# Patient Record
Sex: Female | Born: 1985 | Race: Black or African American | Hispanic: No | Marital: Married | Smoking: Current every day smoker
Health system: Southern US, Community
[De-identification: ages and names within clinical notes are randomized; demographics above are authoritative.]

---

## 2013-09-16 ENCOUNTER — Emergency Department (HOSPITAL_COMMUNITY)
Admission: EM | Admit: 2013-09-16 | Discharge: 2013-09-16 | Disposition: A | Discharge status: Home / Self Care | Payer: 59 | Attending: Emergency Medicine | Admitting: Emergency Medicine

## 2013-09-16 ENCOUNTER — Encounter (HOSPITAL_COMMUNITY): Payer: Self-pay | Admitting: Emergency Medicine

## 2013-09-16 DIAGNOSIS — F17200 Nicotine dependence, unspecified, uncomplicated: Secondary | ICD-10-CM | POA: Insufficient documentation

## 2013-09-16 DIAGNOSIS — Z79899 Other long term (current) drug therapy: Secondary | ICD-10-CM | POA: Insufficient documentation

## 2013-09-16 DIAGNOSIS — Z3202 Encounter for pregnancy test, result negative: Secondary | ICD-10-CM | POA: Insufficient documentation

## 2013-09-16 DIAGNOSIS — E871 Hypo-osmolality and hyponatremia: Secondary | ICD-10-CM | POA: Insufficient documentation

## 2013-09-16 DIAGNOSIS — N949 Unspecified condition associated with female genital organs and menstrual cycle: Secondary | ICD-10-CM | POA: Insufficient documentation

## 2013-09-16 DIAGNOSIS — N912 Amenorrhea, unspecified: Secondary | ICD-10-CM | POA: Insufficient documentation

## 2013-09-16 LAB — CBC WITH DIFFERENTIAL/PLATELET
Basophils Absolute: 0 10*3/uL (ref 0.0–0.1)
Basophils Relative: 0 % (ref 0–1)
Eosinophils Relative: 3 % (ref 0–5)
Lymphocytes Relative: 26 % (ref 12–46)
Lymphs Abs: 1.7 10*3/uL (ref 0.7–4.0)
MCV: 96 fL (ref 78.0–100.0)
Neutro Abs: 3.9 10*3/uL (ref 1.7–7.7)
Neutrophils Relative %: 61 % (ref 43–77)
Platelets: 255 10*3/uL (ref 150–400)
RDW: 12 % (ref 11.5–15.5)
WBC: 6.4 10*3/uL (ref 4.0–10.5)

## 2013-09-16 LAB — COMPREHENSIVE METABOLIC PANEL
ALT: 9 U/L (ref 0–35)
AST: 16 U/L (ref 0–37)
Albumin: 3.8 g/dL (ref 3.5–5.2)
CO2: 27 mEq/L (ref 19–32)
Calcium: 8.6 mg/dL (ref 8.4–10.5)
Chloride: 116 mEq/L — ABNORMAL HIGH (ref 96–112)
GFR calc non Af Amer: >90 mL/min (ref >90)
Glucose, Bld: 87 mg/dL (ref 70–99)
Sodium: 123 mEq/L — ABNORMAL LOW (ref 135–145)
Total Bilirubin: 0.4 mg/dL (ref 0.3–1.2)

## 2013-09-16 LAB — WET PREP, GENITAL

## 2013-09-16 NOTE — ED Provider Notes (Signed)
CSN: [id number]     Arrival date & time 09/16/13  1425 History   First MD Initiated Contact with Patient 09/16/13 1758     Chief Complaint  Patient presents with  . Abdominal Pain   (Consider location/radiation/quality/duration/timing/severity/associated sxs/prior Treatment) HPI Comments: Patient presents to the ER for evaluation of abdominal pain. Patient reports that she did not have her normal menstrual period last month. She reports that usually she is very regular. She does not, however, regularly see a gynecologist. She reports that she started having cramping today and thought that it was going to be her regular period. The pain, however, became severe, across her entire lower abdomen and pelvis. She took ibuprofen and the pain improved. No nausea, vomiting, diarrhea, constipation. Has not had fever. Currently no pain.  Patient is a 27 y.o. female presenting with abdominal pain.  Abdominal Pain   History reviewed. No pertinent past medical history. History reviewed. No pertinent past surgical history. No family history on file. History  Substance Use Topics  . Smoking status: Current Every Day Smoker  . Smokeless tobacco: Not on file  . Alcohol Use: Yes   OB History   Grav Para Term Preterm Abortions TAB SAB Ect Mult Living                 Review of Systems  Gastrointestinal: Positive for abdominal pain.  Genitourinary: Positive for pelvic pain.  All other systems reviewed and are negative.    Allergies  Review of patient's allergies indicates no known allergies.  Home Medications   Current Outpatient Rx  Name  Route  Sig  Dispense  Refill  . Acetaminophen-Caff-Pyrilamine (MIDOL COMPLETE PO)   Oral   Take 2 tablets by mouth daily.         Marland Kitchen ibuprofen (ADVIL,MOTRIN) 200 MG tablet   Oral   Take 400 mg by mouth every 6 (six) hours as needed for fever.          BP 117/72  Pulse 61  Temp(Src) 98.2 F (36.8 C) (Oral)  Resp 16  SpO2 100% Physical Exam   Constitutional: She is oriented to person, place, and time. She appears well-developed and well-nourished. No distress.  HENT:  Head: Normocephalic and atraumatic.  Right Ear: Hearing normal.  Left Ear: Hearing normal.  Nose: Nose normal.  Mouth/Throat: Oropharynx is clear and moist and mucous membranes are normal.  Eyes: Conjunctivae and EOM are normal. Pupils are equal, round, and reactive to light.  Neck: Normal range of motion. Neck supple.  Cardiovascular: Regular rhythm, S1 normal and S2 normal.  Exam reveals no gallop and no friction rub.   No murmur heard. Pulmonary/Chest: Effort normal and breath sounds normal. No respiratory distress. She exhibits no tenderness.  Abdominal: Soft. Normal appearance and bowel sounds are normal. There is no hepatosplenomegaly. There is no tenderness. There is no rebound, no guarding, no tenderness at McBurney's point and negative Murphy's sign. No hernia.  Genitourinary: Uterus is tender. Cervix exhibits no motion tenderness and no discharge. Right adnexum displays no mass and no tenderness. Left adnexum displays no mass and no tenderness.  Musculoskeletal: Normal range of motion.  Neurological: She is alert and oriented to person, place, and time. She has normal strength. No cranial nerve deficit or sensory deficit. Coordination normal. GCS eye subscore is 4. GCS verbal subscore is 5. GCS motor subscore is 6.  Skin: Skin is warm, dry and intact. No rash noted. No cyanosis.  Psychiatric: She has a normal mood  and affect. Her speech is normal and behavior is normal. Thought content normal.    ED Course  Procedures (including critical care time) Labs Review Labs Reviewed  WET PREP, GENITAL - Abnormal; Notable for the following:    Trich, Wet Prep FEW (*)    WBC, Wet Prep HPF POC FEW (*)    All other components within normal limits  COMPREHENSIVE METABOLIC PANEL - Abnormal; Notable for the following:    Sodium 123 (*)    Chloride 116 (*)    All  other components within normal limits  GC/CHLAMYDIA PROBE AMP  CBC WITH DIFFERENTIAL  URINALYSIS, ROUTINE W REFLEX MICROSCOPIC  TSH  POCT PREGNANCY, URINE   Imaging Review No results found.  EKG Interpretation   None       MDM   1. Amenorrhea   2. Hyponatremia    Patient presents to the ER for evaluation of pelvic pain. Patient reports that she did not have a menstrual period last month and then today started having cramping pain it felt like her period coming on. She took ibuprofen and the pain resolved. She came in to the archive however because the pain was severe when it was present. She did have a small amount of blood in the vaginal vault with a closed cervix. The examination was otherwise unremarkable. Patient's blood work reveals hyponatremia of unclear etiology.  I discussed the findings with the patient. I offered her admission for further workup. Did discuss with her the possibility of pituitary adrenal axis abnormality or tumor causing the symptoms which would require further workup. Patient reports that she has been in the emergency department for 7 hours and would like to go home and get some food. She is a primary care doctor in [place] and repeat blood work and perform any further testing. She is asymptomatic thus far with hypernatremia. She and her significant other were told that severe hyponatremia could cause seizure. She has progressively worsening generalized weakness, seizure, will return to the ER immediately. Patient's blood work results were given to her to take her primary care doctor.    Gilda Crease, MD 09/16/13 2117

## 2013-09-16 NOTE — ED Notes (Addendum)
abd pain started cramps x 2 weeks has had some bleeding and increas pain LMP was 10/14 G1 P0 A0 pt playing on phone the entire time i am asking questions

## 2013-09-17 LAB — GC/CHLAMYDIA PROBE AMP
CT Probe RNA: NEGATIVE
GC Probe RNA: NEGATIVE

## 2014-01-18 ENCOUNTER — Emergency Department (HOSPITAL_COMMUNITY): Payer: 59

## 2014-01-18 ENCOUNTER — Emergency Department (HOSPITAL_COMMUNITY)
Admission: EM | Admit: 2014-01-18 | Discharge: 2014-01-19 | Disposition: A | Discharge status: Home / Self Care | Payer: 59 | Attending: Emergency Medicine | Admitting: Emergency Medicine

## 2014-01-18 ENCOUNTER — Encounter (HOSPITAL_COMMUNITY): Payer: Self-pay | Admitting: Emergency Medicine

## 2014-01-18 DIAGNOSIS — Z79899 Other long term (current) drug therapy: Secondary | ICD-10-CM | POA: Insufficient documentation

## 2014-01-18 DIAGNOSIS — O99330 Smoking (tobacco) complicating pregnancy, unspecified trimester: Secondary | ICD-10-CM | POA: Insufficient documentation

## 2014-01-18 DIAGNOSIS — N939 Abnormal uterine and vaginal bleeding, unspecified: Secondary | ICD-10-CM

## 2014-01-18 DIAGNOSIS — O2390 Unspecified genitourinary tract infection in pregnancy, unspecified trimester: Secondary | ICD-10-CM | POA: Insufficient documentation

## 2014-01-18 DIAGNOSIS — N760 Acute vaginitis: Secondary | ICD-10-CM | POA: Insufficient documentation

## 2014-01-18 DIAGNOSIS — B9689 Other specified bacterial agents as the cause of diseases classified elsewhere: Secondary | ICD-10-CM | POA: Insufficient documentation

## 2014-01-18 DIAGNOSIS — O200 Threatened abortion: Secondary | ICD-10-CM | POA: Insufficient documentation

## 2014-01-18 DIAGNOSIS — Z3490 Encounter for supervision of normal pregnancy, unspecified, unspecified trimester: Secondary | ICD-10-CM

## 2014-01-18 DIAGNOSIS — A499 Bacterial infection, unspecified: Secondary | ICD-10-CM | POA: Insufficient documentation

## 2014-01-18 LAB — COMPREHENSIVE METABOLIC PANEL
ALK PHOS: 47 U/L (ref 39–117)
ALT: 6 U/L (ref 0–35)
AST: 16 U/L (ref 0–37)
Albumin: 3.4 g/dL — ABNORMAL LOW (ref 3.5–5.2)
BILIRUBIN TOTAL: 0.3 mg/dL (ref 0.3–1.2)
BUN: 7 mg/dL (ref 6–23)
CHLORIDE: 102 meq/L (ref 96–112)
CO2: 24 mEq/L (ref 19–32)
Calcium: 8.7 mg/dL (ref 8.4–10.5)
Creatinine, Ser: 0.62 mg/dL (ref 0.50–1.10)
GFR calc Af Amer: >90 mL/min (ref >90)
GFR calc non Af Amer: >90 mL/min (ref >90)
Glucose, Bld: 79 mg/dL (ref 70–99)
Potassium: 4.1 mEq/L (ref 3.7–5.3)
Sodium: 138 mEq/L (ref 137–147)
Total Protein: 7.1 g/dL (ref 6.0–8.3)

## 2014-01-18 LAB — TYPE AND SCREEN
ABO/RH(D): O POS
Antibody Screen: NEGATIVE

## 2014-01-18 LAB — CBC WITH DIFFERENTIAL/PLATELET
Basophils Absolute: 0 10*3/uL (ref 0.0–0.1)
Basophils Relative: 0 % (ref 0–1)
Eosinophils Absolute: 0.1 10*3/uL (ref 0.0–0.7)
Eosinophils Relative: 1 % (ref 0–5)
HCT: 37.3 % (ref 36.0–46.0)
Hemoglobin: 13.1 g/dL (ref 12.0–15.0)
LYMPHS PCT: 24 % (ref 12–46)
Lymphs Abs: 2.1 10*3/uL (ref 0.7–4.0)
MCH: 33.2 pg (ref 26.0–34.0)
MCHC: 35.1 g/dL (ref 30.0–36.0)
MCV: 94.4 fL (ref 78.0–100.0)
MONOS PCT: 7 % (ref 3–12)
Monocytes Absolute: 0.6 10*3/uL (ref 0.1–1.0)
NEUTROS ABS: 5.9 10*3/uL (ref 1.7–7.7)
Neutrophils Relative %: 68 % (ref 43–77)
Platelets: 280 10*3/uL (ref 150–400)
RBC: 3.95 MIL/uL (ref 3.87–5.11)
RDW: 12 % (ref 11.5–15.5)
WBC: 8.7 10*3/uL (ref 4.0–10.5)

## 2014-01-18 LAB — WET PREP, GENITAL
TRICH WET PREP: NONE SEEN
WBC, Wet Prep HPF POC: NONE SEEN
Yeast Wet Prep HPF POC: NONE SEEN

## 2014-01-18 LAB — HCG, QUANTITATIVE, PREGNANCY: HCG, BETA CHAIN, QUANT, S: 82996 m[IU]/mL — AB (ref <5)

## 2014-01-18 NOTE — ED Provider Notes (Signed)
TIME SEEN: 10:06 PM  CHIEF COMPLAINT: Vaginal bleeding  HPI: Patient is a 28 year old female who is a G1 P0 who presents to the emergency department with complaints of vaginal bleeding. She reports that she went to the bathroom tonight and noticed blood in the urine or blood on her toilet paper. She's not passing clots. No chest pain, shortness breath, lightheadedness. No abdominal cramping. No vaginal discharge. No nausea, vomiting or diarrhea. No fevers or chills. No dysuria. Last menstrual period was January 5. She is 12 weeks and 5 days pregnant by LMP. She is followed by Dr. Karie MainlandBrad Jacobson [place] for this pregnancy. She has had a transvaginal ultrasound confirming an intrauterine pregnancy.  ROS: See HPI Constitutional: no fever  Eyes: no drainage  ENT: no runny nose   Cardiovascular:  no chest pain  Resp: no SOB  GI: no vomiting GU: no dysuria Integumentary: no rash  Allergy: no hives  Musculoskeletal: no leg swelling  Neurological: no slurred speech ROS otherwise negative  PAST MEDICAL HISTORY/PAST SURGICAL HISTORY:  History reviewed. No pertinent past medical history.  MEDICATIONS:  Prior to Admission medications   Medication Sig Start Date End Date Taking? Authorizing Provider  diphenhydrAMINE (BENADRYL) 12.5 MG/5ML liquid Take 12.5 mg by mouth daily as needed for allergies.   Yes Historical Provider, MD  Docosahexaenoic Acid (PRENATAL DHA PO) Take 1 tablet by mouth daily.   Yes Historical Provider, MD    ALLERGIES:  No Known Allergies  SOCIAL HISTORY:  History  Substance Use Topics  . Smoking status: Current Every Day Smoker  . Smokeless tobacco: Not on file  . Alcohol Use: Yes    FAMILY HISTORY: No family history on file.  EXAM: BP 117/69  Pulse 66  Temp(Src) 98.3 F (36.8 C) (Oral)  Resp 18  Ht 5\' 11"  (1.803 m)  Wt 173 lb (78.472 kg)  BMI 24.14 kg/m2  SpO2 97%  LMP 10/21/2013 CONSTITUTIONAL: Alert and oriented and responds appropriately to  questions. Well-appearing; well-nourished HEAD: Normocephalic EYES: Conjunctivae clear, PERRL, no conjunctival pallor ENT: normal nose; no rhinorrhea; moist mucous membranes; pharynx without lesions noted NECK: Supple, no meningismus, no LAD  CARD: RRR; S1 and S2 appreciated; no murmurs, no clicks, no rubs, no gallops RESP: Normal chest excursion without splinting or tachypnea; breath sounds clear and equal bilaterally; no wheezes, no rhonchi, no rales,  ABD/GI: Normal bowel sounds; non-distended; soft, non-tender, no rebound, no guarding GU:  Normal external genitalia, no amount of thin amount of vaginal discharge coming from the cervical os, cervix is friable and bleeds easy with swabbing, cervix is thick and closed and high; no cervical motion tenderness, no adnexal tenderness or fullness BACK:  The back appears normal and is non-tender to palpation, there is no CVA tenderness EXT: Normal ROM in all joints; non-tender to palpation; no edema; normal capillary refill; no cyanosis    SKIN: Normal color for age and race; warm NEURO: Moves all extremities equally PSYCH: The patient's mood and manner are appropriate. Grooming and personal hygiene are appropriate.  MEDICAL DECISION MAKING: Patient here with vaginal bleeding in her second trimester of pregnancy. She only has a minimal amount of bleeding on exam. Her hemoglobin is 13.1. Her beta hCG is greater than 82,000. Her blood type is O+, she does not need RhoGAM. Her wet prep shows a few clue cells but no other sign of infection. Transvaginal ultrasound pending.  ED PROGRESS: Patient's transvaginal ultrasound shows a single anterior IUP is consistent with 13 weeks, 0 days.  Fetal heart rate is 150. Urine shows blood but this is likely due to her vaginal bleeding. Her also trace leukocytes but rare bacteria and a few squamous cells. Suspect are intact. We'll discharge home with threatened abortion return precautions and supportive care instructions.  Will discharge with prescription for Flagyl. Patient and family at bedside verbalize understanding and are comfortable with this plan.     Layla Maw Kinesha Auten, DO 01/19/14 [phone]

## 2014-01-18 NOTE — ED Notes (Signed)
Pt. reports vaginal bleeding onset this evening , pt is [redacted] weeks pregnant ( G1P0 ) . Denies abdominal pain or cramping .

## 2014-01-19 LAB — URINALYSIS, ROUTINE W REFLEX MICROSCOPIC
Bilirubin Urine: NEGATIVE
Glucose, UA: NEGATIVE mg/dL
Ketones, ur: 15 mg/dL — AB
Nitrite: NEGATIVE
PH: 6 (ref 5.0–8.0)
Protein, ur: NEGATIVE mg/dL
Specific Gravity, Urine: 1.015 (ref 1.005–1.030)
Urobilinogen, UA: 0.2 mg/dL (ref 0.0–1.0)

## 2014-01-19 LAB — ABO/RH: ABO/RH(D): O POS

## 2014-01-19 LAB — URINE MICROSCOPIC-ADD ON

## 2014-01-19 MED ORDER — METRONIDAZOLE 500 MG PO TABS: 500.0000 mg | ORAL_TABLET | Freq: Two times a day (BID) | ORAL | Status: AC

## 2014-01-19 MED ORDER — METRONIDAZOLE 500 MG PO TABS
500.0000 mg | ORAL_TABLET | Freq: Once | ORAL | Status: AC
  Administered 2014-01-19: 500 mg via ORAL
  Filled 2014-01-19: qty 1

## 2014-01-19 NOTE — Discharge Instructions (Signed)
Bacterial Vaginosis °Bacterial vaginosis is a vaginal infection that occurs when the normal balance of bacteria in the vagina is disrupted. It results from an overgrowth of certain bacteria. This is the most common vaginal infection in women of childbearing age. Treatment is important to prevent complications, especially in pregnant women, as it can cause a premature delivery. °CAUSES  °Bacterial vaginosis is caused by an increase in harmful bacteria that are normally present in smaller amounts in the vagina. Several different kinds of bacteria can cause bacterial vaginosis. However, the reason that the condition develops is not fully understood. °RISK FACTORS °Certain activities or behaviors can put you at an increased risk of developing bacterial vaginosis, including: °· Having a new sex partner or multiple sex partners. °· Douching. °· Using an intrauterine device (IUD) for contraception. °Women do not get bacterial vaginosis from toilet seats, bedding, swimming pools, or contact with objects around them. °SIGNS AND SYMPTOMS  °Some women with bacterial vaginosis have no signs or symptoms. Common symptoms include: °· Grey vaginal discharge. °· A fishlike odor with discharge, especially after sexual intercourse. °· Itching or burning of the vagina and vulva. °· Burning or pain with urination. °DIAGNOSIS  °Your health care provider will take a medical history and examine the vagina for signs of bacterial vaginosis. A sample of vaginal fluid may be taken. Your health care provider will look at this sample under a microscope to check for bacteria and abnormal cells. A vaginal pH test may also be done.  °TREATMENT  °Bacterial vaginosis may be treated with antibiotic medicines. These may be given in the form of a pill or a vaginal cream. A second round of antibiotics may be prescribed if the condition comes back after treatment.  °HOME CARE INSTRUCTIONS  °· Only take over-the-counter or prescription medicines as  directed by your health care provider. °· If antibiotic medicine was prescribed, take it as directed. Make sure you finish it even if you start to feel better. °· Do not have sex until treatment is completed. °· Tell all sexual partners that you have a vaginal infection. They should see their health care provider and be treated if they have problems, such as a mild rash or itching. °· Practice safe sex by using condoms and only having one sex partner. °SEEK MEDICAL CARE IF:  °· Your symptoms are not improving after 3 days of treatment. °· You have increased discharge or pain. °· You have a fever. °MAKE SURE YOU:  °· Understand these instructions. °· Will watch your condition. °· Will get help right away if you are not doing well or get worse. °FOR MORE INFORMATION  °Centers for Disease Control and Prevention, Division of STD Prevention: www.cdc.gov/std °American Sexual Health Association (ASHA): www.ashastd.org  °Document Released: 10/03/2005 Document Revised: 07/24/2013 Document Reviewed: 05/15/2013 °ExitCare® Patient Information ©2014 [employer]. °Pregnancy °If you are planning on getting pregnant, it is a good idea to make a preconception appointment with your caregiver to discuss having a healthy lifestyle before getting pregnant. This includes diet, weight, exercise, taking prenatal vitamins (especially folic acid, which helps prevent brain and spinal cord defects), avoiding alcohol, smoking and illegal drugs, medical problems (diabetes, convulsions), family history of genetic problems, working conditions, and immunizations. It is better to have knowledge of these things and do something about them before getting pregnant. °During your pregnancy, it is important to follow certain guidelines in order to have a healthy baby. It is very important to get good prenatal care and follow your   your caregiver's instructions. Prenatal care includes all the medical care you receive before your baby's birth. This helps to  prevent problems during the pregnancy and childbirth. HOME CARE INSTRUCTIONS   Start your prenatal visits by the 12th week of pregnancy or earlier, if possible. At first, appointments are usually scheduled monthly. They become more frequent in the last 2 months before delivery. It is important that you keep your caregiver's appointments and follow your caregiver's instructions regarding medication use, exercise, and diet.  During pregnancy, you are providing food for you and your baby. Eat a regular, well-balanced diet. Choose foods such as meat, fish, milk and other dairy products, vegetables, fruits, whole-grain breads and cereals. Your caregiver will inform you of the ideal weight gain depending on your current height and weight. Drink lots of liquids. Try to drink 8 glasses of water a day.  Alcohol is associated with a number of birth defects including fetal alcohol syndrome. It is best to avoid alcohol completely. Smoking will cause low birth rate and prematurity. Use of alcohol and nicotine during your pregnancy also increases the chances that your child will be chemically dependent later in their life and may contribute to SIDS (Sudden Infant Death Syndrome).  Do not use illegal drugs.  Only take prescription or over-the-counter medications that are recommended by your caregiver. Other medications can cause genetic and physical problems in the baby.  Morning sickness can often be helped by keeping soda crackers at the bedside. Eat a few before getting up in the morning.  A sexual relationship may be continued until near the end of pregnancy if there are no other problems such as early (premature) leaking of amniotic fluid from the membranes, vaginal bleeding, painful intercourse or belly (abdominal) pain.  Exercise regularly. Check with your caregiver if you are unsure of the safety of some of your exercises.  Do not use hot tubs, steam rooms or saunas. These increase the risk of fainting  and hurting yourself and the baby. Swimming is OK for exercise. Get plenty of rest, including afternoon naps when possible, especially in the third trimester.  Avoid toxic odors and chemicals.  Do not wear high heels. They may cause you to lose your balance and fall.  Do not lift over 5 pounds. If you do lift anything, lift with your legs and thighs, not your back.  Avoid long trips, especially in the third trimester.  If you have to travel out of the city or state, take a copy of your medical records with you. SEEK IMMEDIATE MEDICAL CARE IF:   You develop an unexplained oral temperature above 102 F (38.9 C), or as your caregiver suggests.  You have leaking of fluid from the vagina. If leaking membranes are suspected, take your temperature and inform your caregiver of this when you call.  There is vaginal spotting or bleeding. Notify your caregiver of the amount and how many pads are used.  You continue to feel sick to your stomach (nauseous) and have no relief from remedies suggested, or you throw up (vomit) blood or coffee ground like materials.  You develop upper abdominal pain.  You have round ligament discomfort in the lower abdominal area. This still must be evaluated by your caregiver.  You feel contractions of the uterus.  You do not feel the baby move, or there is less movement than before.  You have painful urination.  You have abnormal vaginal discharge.  You have persistent diarrhea.  You get a severe headache.  You have problems with your vision.  You develop muscle weakness.  You feel dizzy and faint.  You develop shortness of breath.  You develop chest pain.  You have back pain that travels down to your leg and feet.  You feel irregular or a very fast heartbeat.  You develop excessive weight gain in a short period of time (5 pounds in 3 to 5 days).  You are involved in a domestic violence situation. Document Released: 10/03/2005 Document Revised:  04/03/2012 Document Reviewed: 03/27/2009 [facility] Patient Information 2014 [place], [place].  Threatened Miscarriage Bleeding during the first 20 weeks of pregnancy is common. This is sometimes called a threatened miscarriage. This is a pregnancy that is threatening to end before the twentieth week of pregnancy. Often this bleeding stops with bed rest or decreased activities as suggested by your caregiver and the pregnancy continues without any more problems. You may be asked to not have sexual intercourse, have orgasms or use tampons until further notice. Sometimes a threatened miscarriage can progress to a complete or incomplete miscarriage. This may or may not require further treatment. Some miscarriages occur before a woman misses a menstrual period and knows she is pregnant. Miscarriages occur in 15 to 20% of all pregnancies and usually occur during the first 13 weeks of the pregnancy. The exact cause of a miscarriage is usually never known. A miscarriage is natures way of ending a pregnancy that is abnormal or would not make it to term. There are some things that may put you at risk to have a miscarriage, such as:  Hormone problems.  Infection of the uterus or cervix.  Chronic illness, diabetes for example, especially if it is not controlled.  Abnormal shaped uterus.  Fibroids in the uterus.  Incompetent cervix (the cervix is too weak to hold the baby).  Smoking.  Drinking too much alcohol. It's best not to drink any alcohol when you are pregnant.  Taking illegal drugs. TREATMENT  When a miscarriage becomes complete and all products of conception (all the tissue in the uterus) have been passed, often no treatment is needed. If you think you passed tissue, save it in a container and take it to your doctor for evaluation. If the miscarriage is incomplete (parts of the fetus or placenta remain in the uterus), further treatment may be needed. The most common reason for further treatment is  continued bleeding (hemorrhage) because pregnancy tissue did not pass out of the uterus. This often occurs if a miscarriage is incomplete. Tissue left behind may also become infected. Treatment usually is dilatation and curettage (the removal of the remaining products of pregnancy. This can be done by a simple sucking procedure (suction curettage) or a simple scraping of the inside of the uterus. This may be done in the hospital or in the caregiver's office. This is only done when your caregiver knows that there is no chance for the pregnancy to proceed to term. This is determined by physical examination, negative pregnancy test, falling pregnancy hormone count and/or, an ultrasound revealing a dead fetus. Miscarriages are often a very emotional time for prospective mothers and fathers. This is not you or your partners fault. It did not occur because of an inadequacy in you or your partner. Nearly all miscarriages occur because the pregnancy has started off wrongly. At least half of these pregnancies have a chromosomal abnormality. It is almost always not inherited. Others may have developmental problems with the fetus or placenta. This does not always show up even  when the products miscarried are studied under the microscope. The miscarriage is nearly always not your fault and it is not likely that you could have prevented it from happening. If you are having emotional and grieving problems, talk to your health care provider and even seek counseling, if necessary, before getting pregnant again. You can begin trying for another pregnancy as soon as your caregiver says it is OK. HOME CARE INSTRUCTIONS   Your caregiver may order bed rest depending on how much bleeding and cramping you are having. You may be limited to only getting up to go to the bathroom. You may be allowed to continue light activity. You may need to make arrangements for the care of your other children and for any other responsibilities.  Keep  track of the number of pads you use each day, how often you have to change pads and how saturated (soaked) they are. Record this information.  DO NOT USE TAMPONS. Do not douche, have sexual intercourse or orgasms until approved by your caregiver.  You may receive a follow up appointment for re-evaluation of your pregnancy and a repeat blood test. Re-evaluation often occurs after 2 days and again in 4 to 6 weeks. It is very important that you follow-up in the recommended time period.  If you are Rh negative and the father is Rh positive or you do not know the fathers' blood type, you may receive a shot (Rh immune globulin) to help prevent abnormal antibodies that can develop and affect the baby in any future pregnancies. SEEK IMMEDIATE MEDICAL CARE IF:  You have severe cramps in your stomach, back, or abdomen.  You have a sudden onset of severe pain in the lower part of your abdomen.  You develop chills.  You run an unexplained temperature of 101 F (38.3 C) or higher.  You pass large clots or tissue. Save any tissue for your caregiver to inspect.  Your bleeding increases or you become light-headed, weak, or have fainting episodes.  You have a gush of fluid from your vagina.  You pass out. This could mean you have a tubal (ectopic) pregnancy. Document Released: 10/03/2005 Document Revised: 12/26/2011 Document Reviewed: 05/19/2008 [facility] Patient Information 2014 [place], [place].

## 2014-01-20 LAB — GC/CHLAMYDIA PROBE AMP
CT PROBE, AMP APTIMA: NEGATIVE
GC PROBE AMP APTIMA: NEGATIVE

## 2014-08-18 ENCOUNTER — Encounter (HOSPITAL_COMMUNITY): Payer: Self-pay | Admitting: Emergency Medicine

## 2015-04-11 IMAGING — US US OB COMP LESS 14 WK
1 series · 14 of 18 positions shown · non-contrast
Comparison: None.

CLINICAL DATA: Pregnancy.  Bleeding.

EXAM:
OBSTETRIC <14 WK ULTRASOUND
TECHNIQUE: Transabdominal ultrasound was performed for evaluation of the
gestation as well as the maternal uterus and adnexal regions.

[Series 1: us ob comp less 14 wk · 0.24mm/px · 18 acquisitions, 14 frames shown]
[im 1/18]
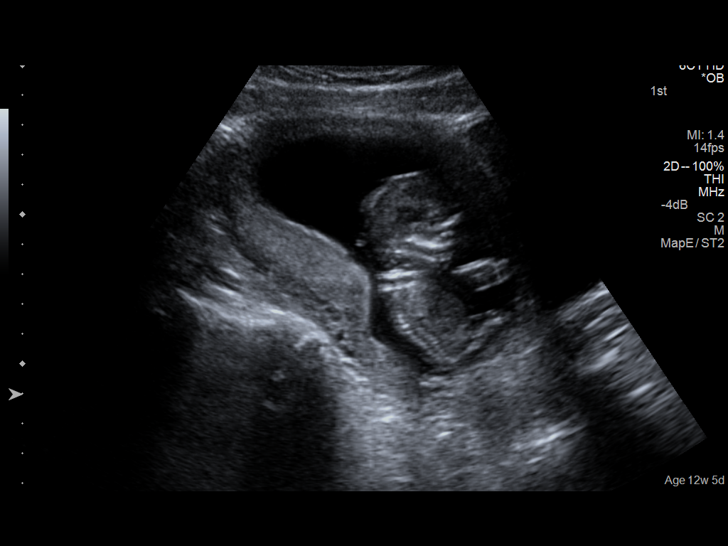
[im 2/18]
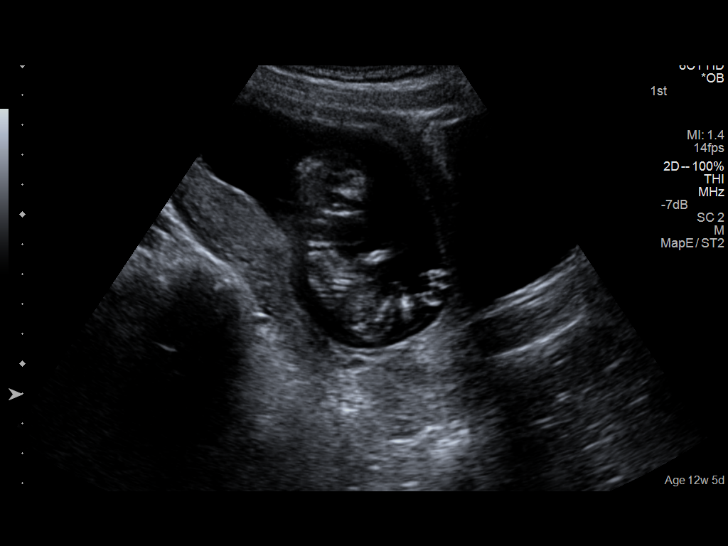
[im 4/18]
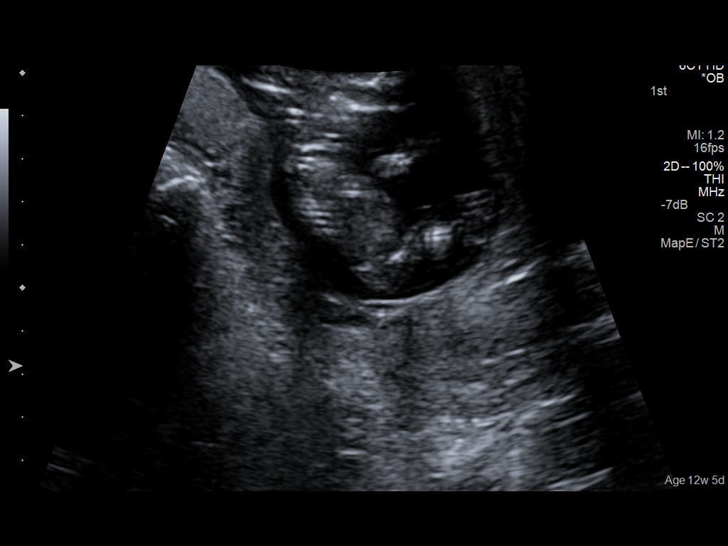
[im 5/18]
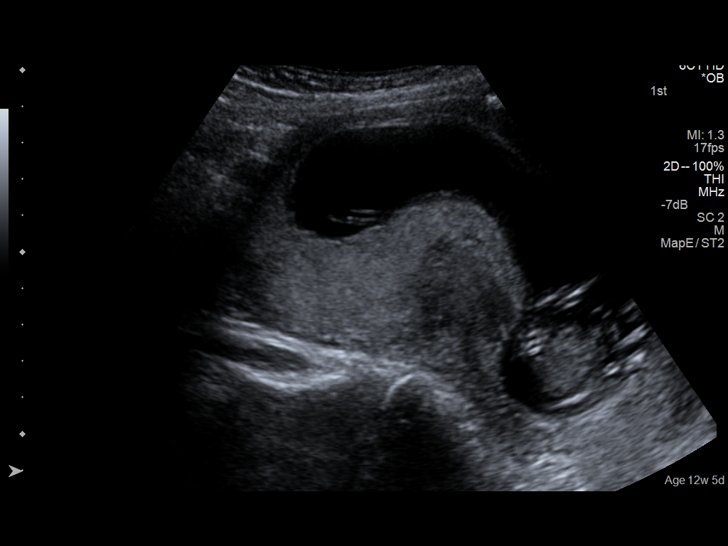
[im 6/18]
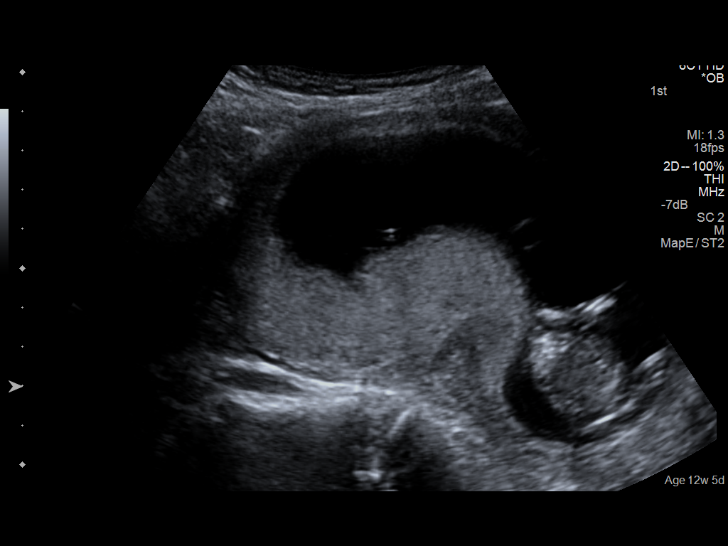
[im 8/18]
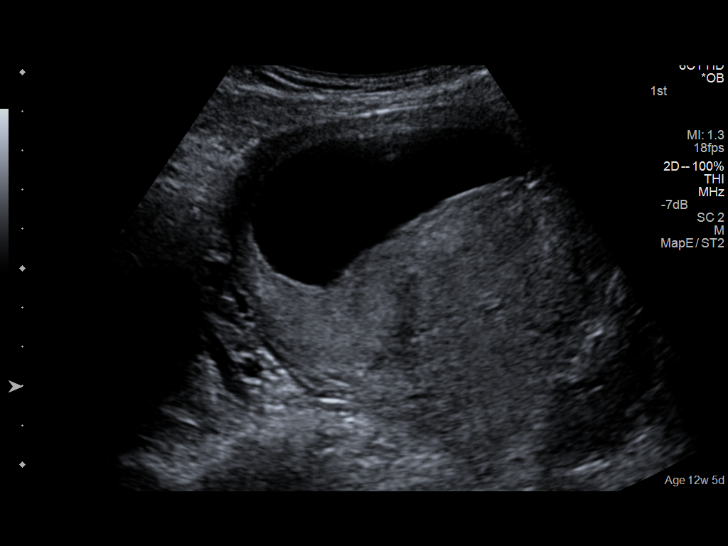
[im 9/18]
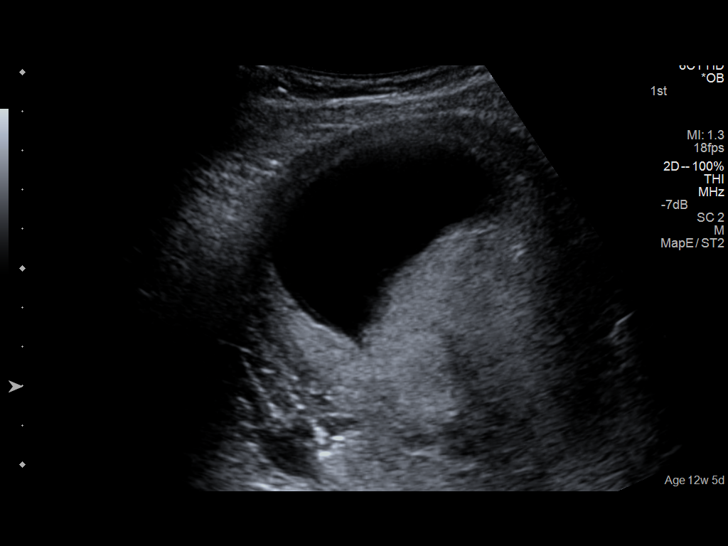
[im 10/18]
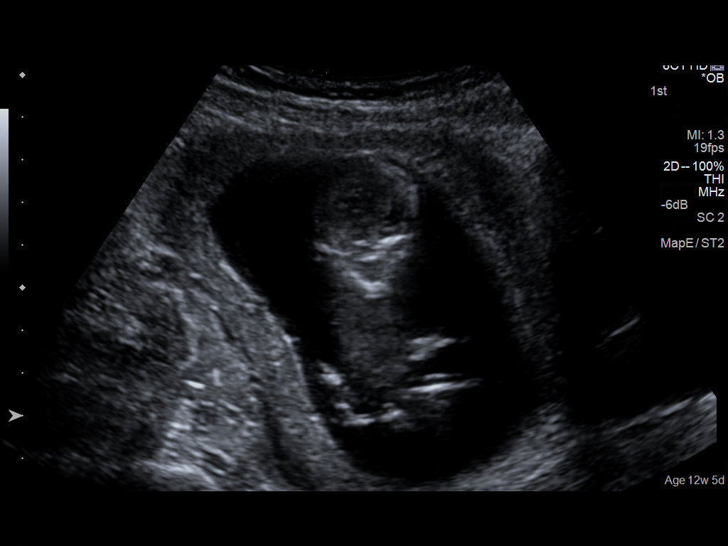
[im 11/18]
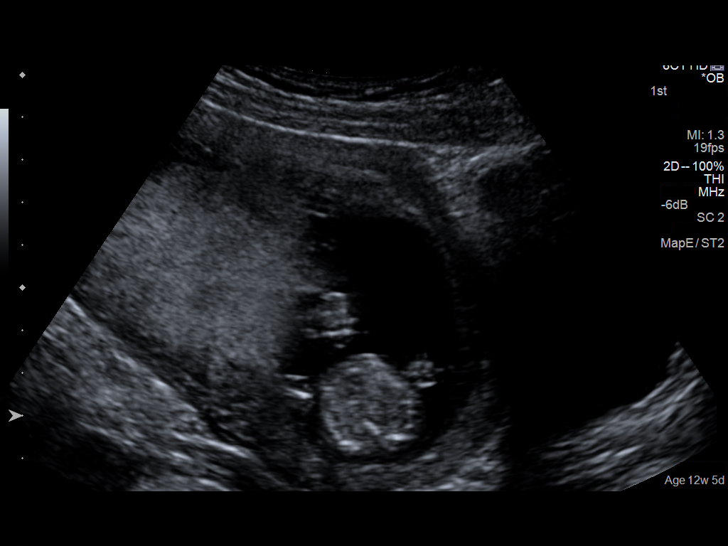
[im 13/18]
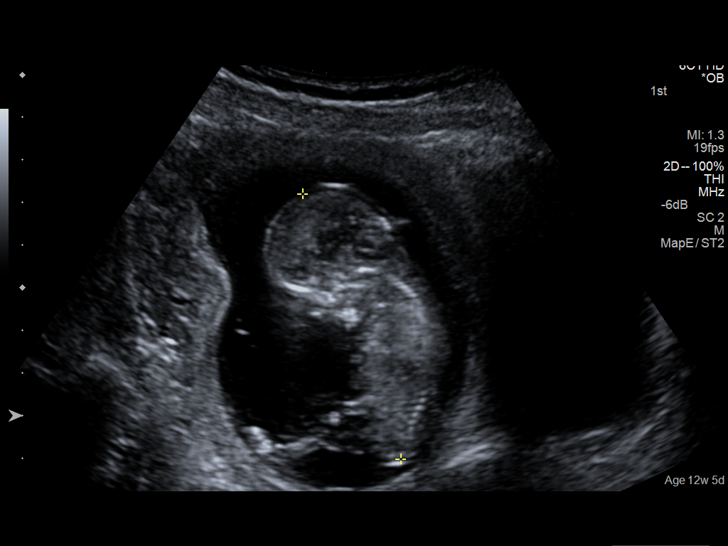
[im 14/18]
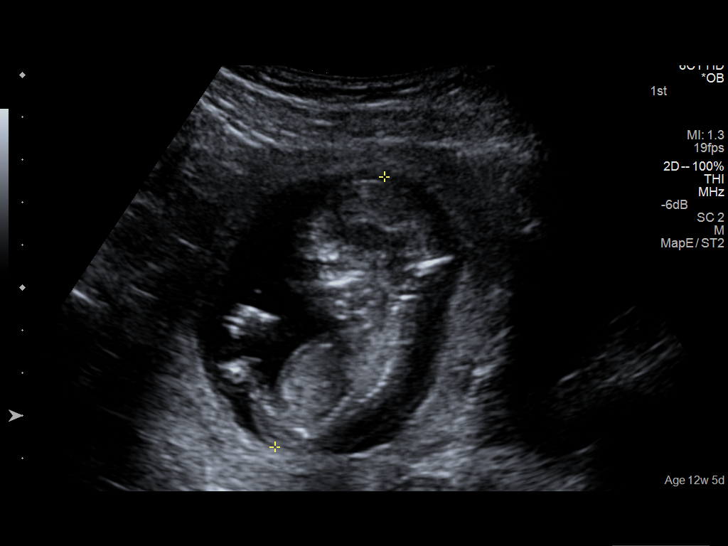
[im 15/18]
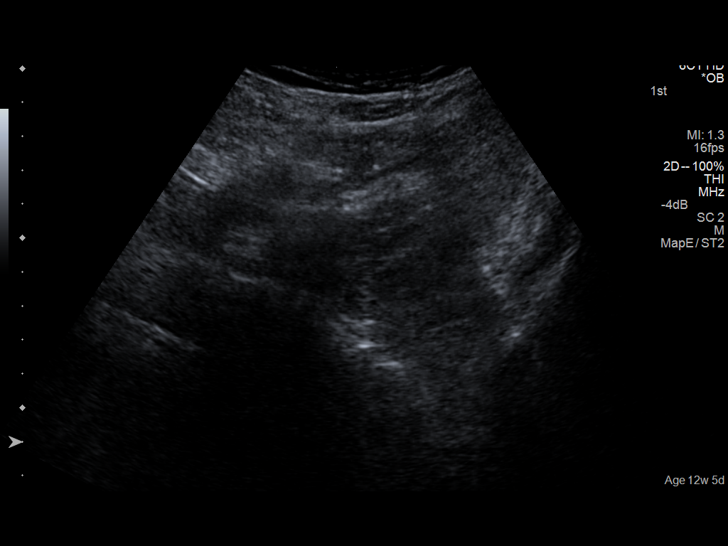
[im 17/18]
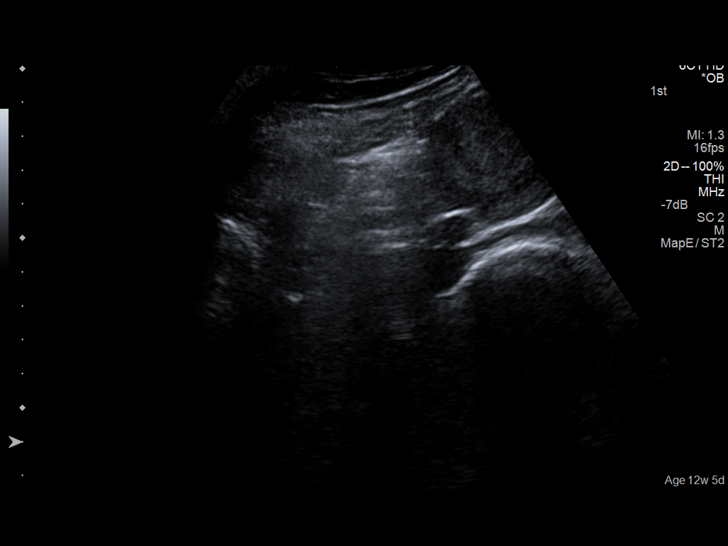
[im 18/18]
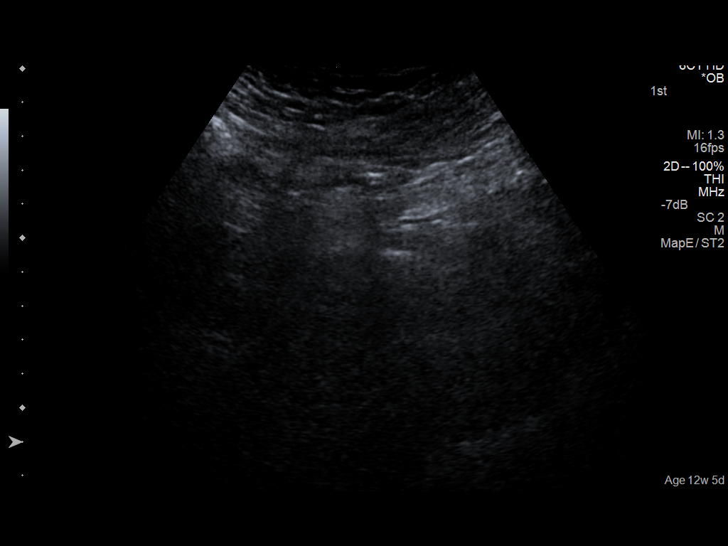

[14 of 18 positions shown; findings below may reference images not displayed]

FINDINGS: Intrauterine gestational sac: Visualized/normal in shape.

Embryo:  Present.

Cardiac Activity: Present.

Heart Rate: 150 bpm

CRL: 6.6 cm  mm   13 w   0  d

US EDC: 07/26/2014

Maternal uterus/adnexae: Unremarkable.
IMPRESSION: Single viable anterior pregnancy at 13 weeks 0 days.
